# Patient Record
Sex: Female | Born: 1990 | Hispanic: No | Smoking: Current every day smoker
Health system: Southern US, Community
[De-identification: ages and names within clinical notes are randomized; demographics above are authoritative.]

---
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
## 2011-06-11 IMAGING — US US OB DETAIL+14 WK
1 series · 18 of 28 positions shown · non-contrast
Comparison: none

OBSTETRICAL ULTRASOUND:
 This ultrasound was performed in The [HOSPITAL], and the AS OB/GYN report will be stored to [REDACTED] PACS.  This report is also available in [HOSPITAL]?s accessANYware.

[Series 1: us ob detail+14 wk · 18 of 101 slices shown]
[im 1/101]
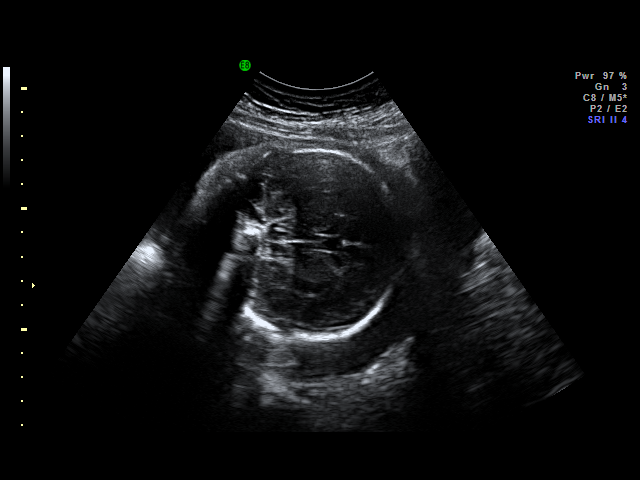
[im 8/101]
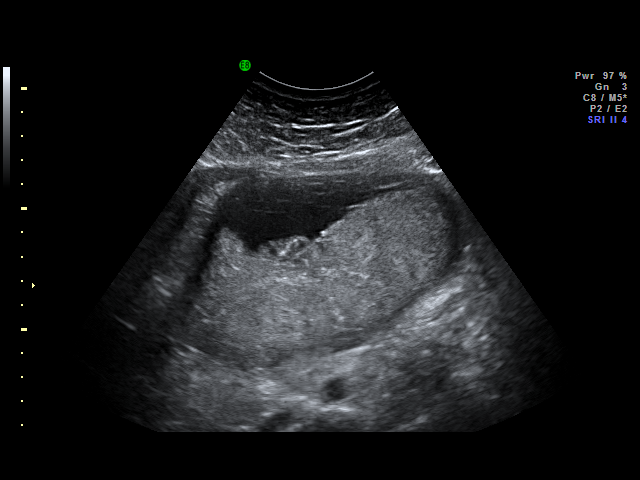
[im 12/101]
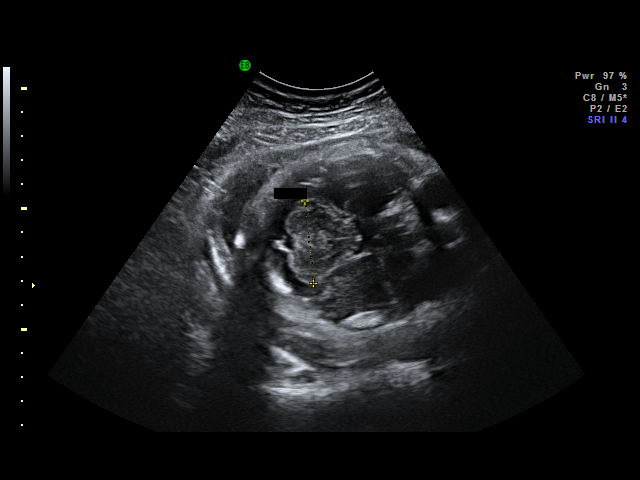
[im 19/101]
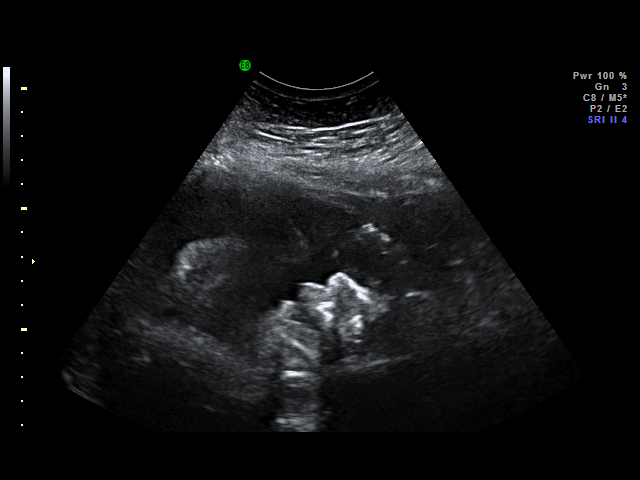
[im 26/101]
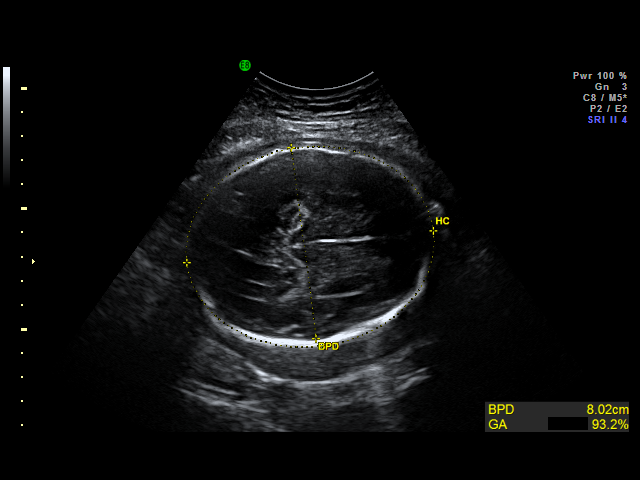
[im 30/101]
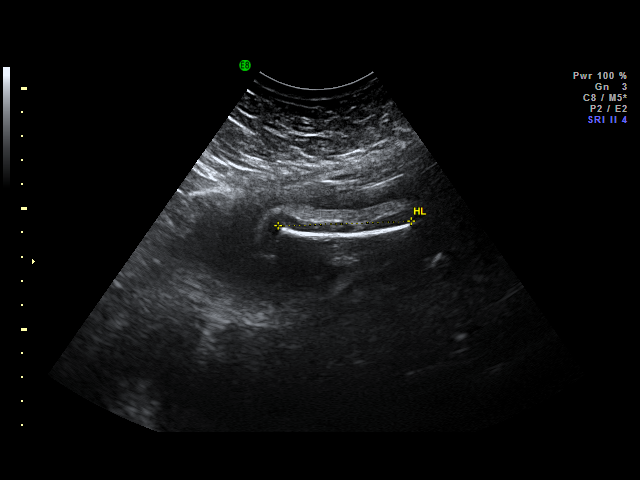
[im 38/101]
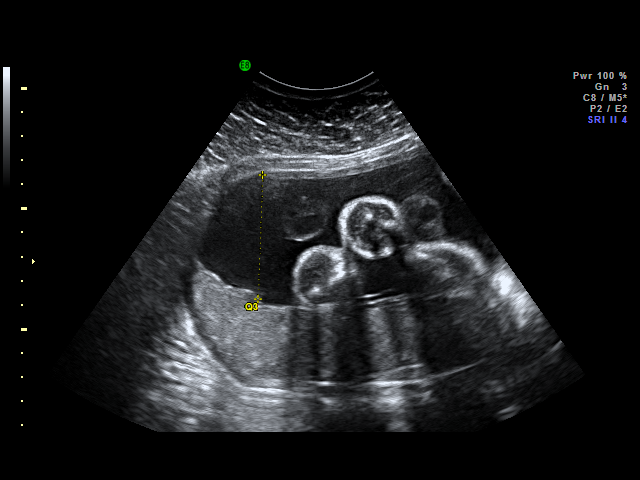
[im 41/101]
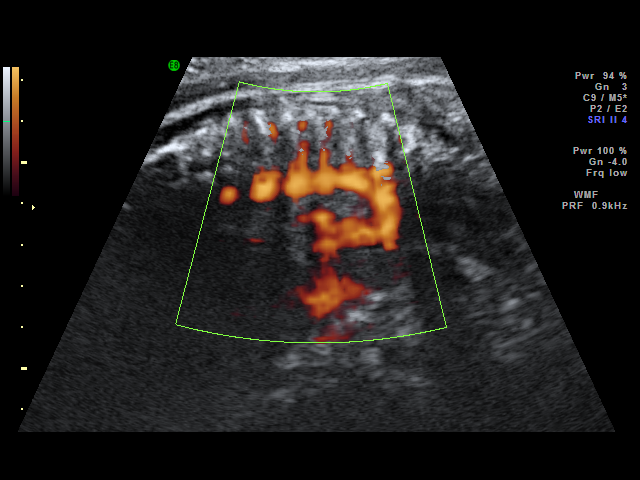
[im 49/101]
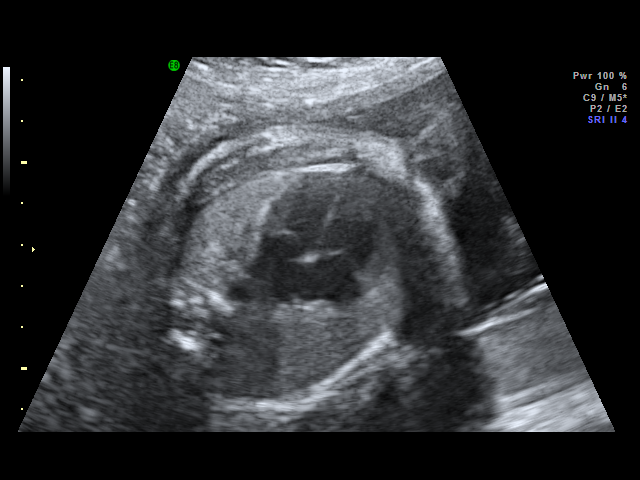
[im 52/101]
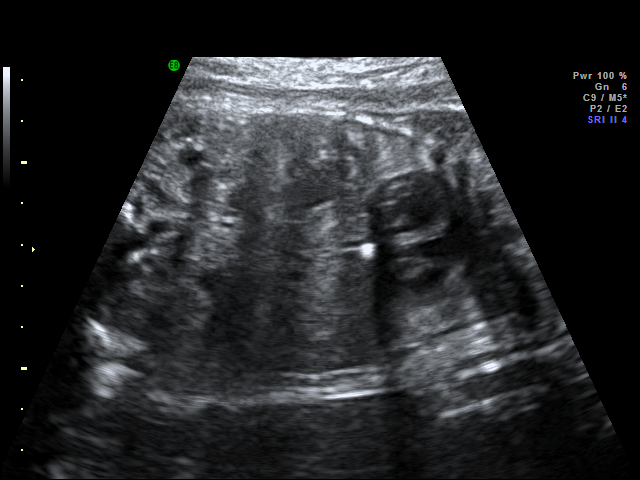
[im 60/101]
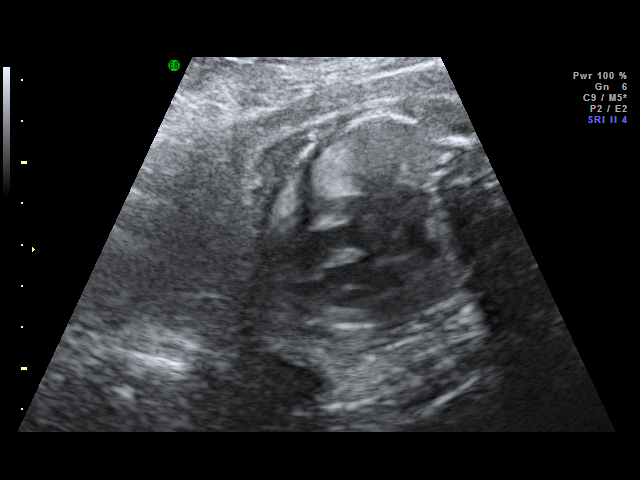
[im 63/101]
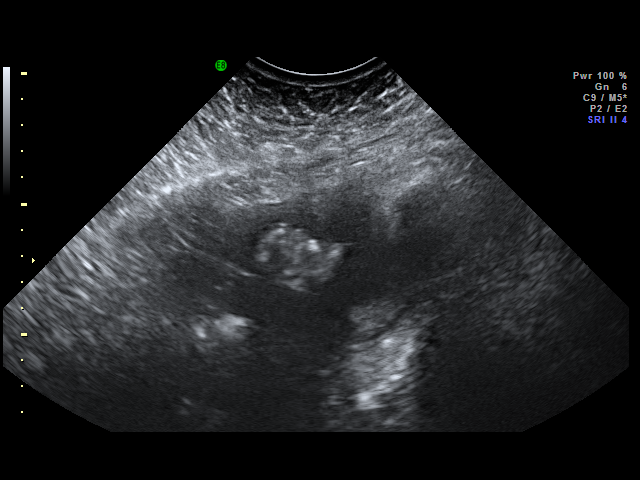
[im 71/101]
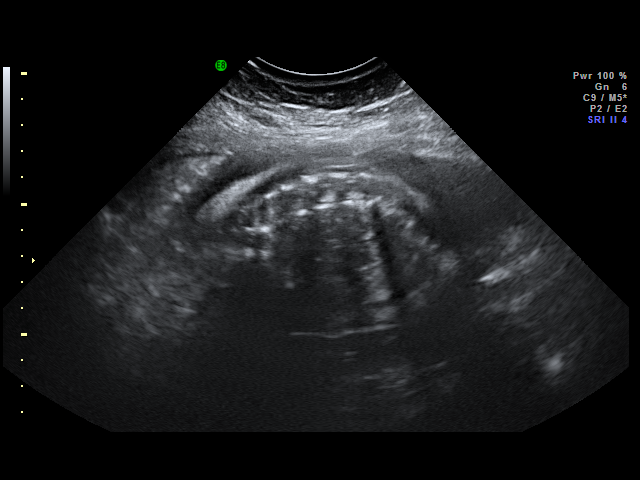
[im 78/101]
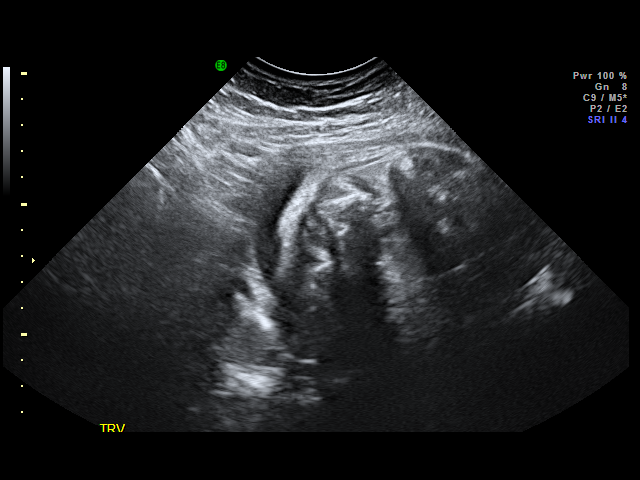
[im 82/101]
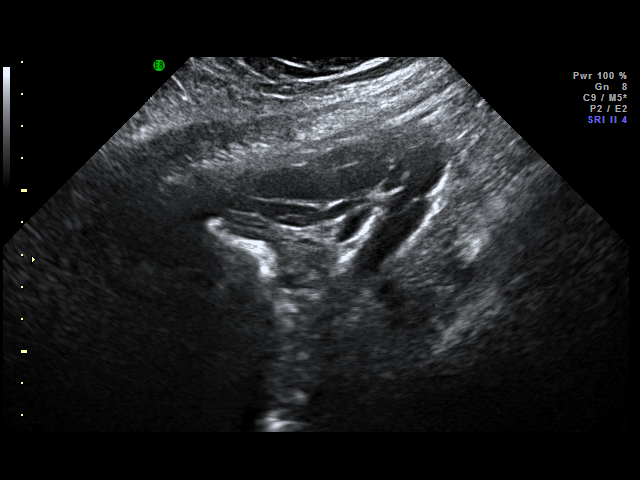
[im 89/101]
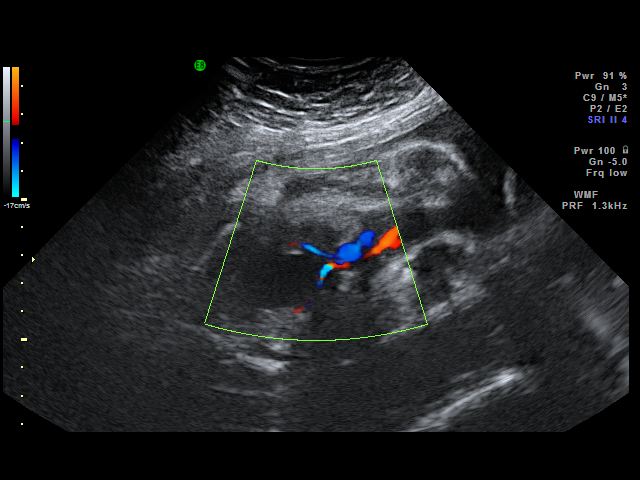
[im 93/101]
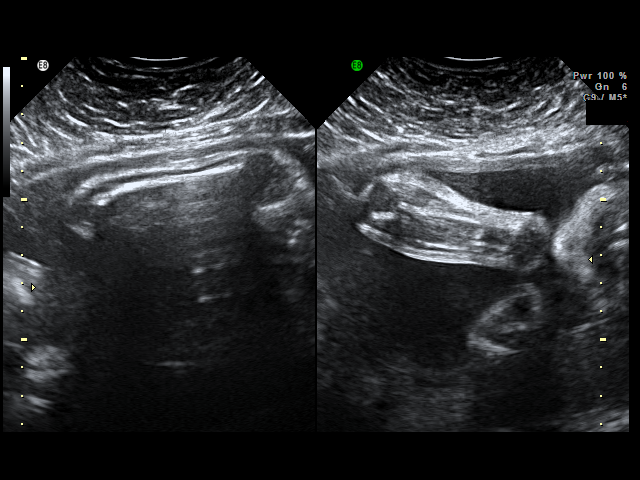
[im 101/101]
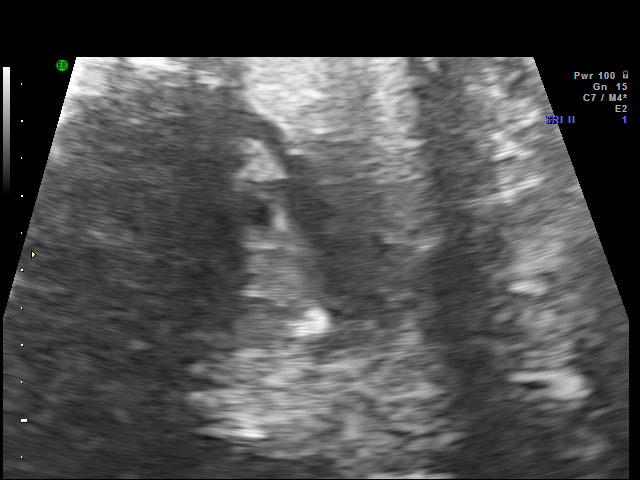

[18 of 28 positions shown; findings below may reference images not displayed]

IMPRESSION: AS OB/GYN has also been faxed to the ordering physician.

## 2011-12-15 ENCOUNTER — Ambulatory Visit: Payer: Medicaid Other | Attending: Orthopedic Surgery | Admitting: Rehabilitative and Restorative Service Providers"

## 2018-01-28 ENCOUNTER — Encounter (HOSPITAL_COMMUNITY): Payer: Self-pay | Admitting: Emergency Medicine

## 2018-01-28 ENCOUNTER — Emergency Department (HOSPITAL_COMMUNITY)
Admission: EM | Admit: 2018-01-28 | Discharge: 2018-01-28 | Discharge status: Against Medical Advice | Payer: Medicaid Other | Attending: Emergency Medicine | Admitting: Emergency Medicine

## 2018-01-28 DIAGNOSIS — Z0441 Encounter for examination and observation following alleged adult rape: Secondary | ICD-10-CM | POA: Insufficient documentation

## 2018-01-28 DIAGNOSIS — F17210 Nicotine dependence, cigarettes, uncomplicated: Secondary | ICD-10-CM | POA: Diagnosis not present

## 2018-01-28 LAB — CBC WITH DIFFERENTIAL/PLATELET
BASOS ABS: 0 10*3/uL (ref 0.0–0.1)
Basophils Relative: 0 %
Eosinophils Absolute: 0.1 10*3/uL (ref 0.0–0.7)
Eosinophils Relative: 3 %
HEMATOCRIT: 33 % — AB (ref 36.0–46.0)
Hemoglobin: 11.1 g/dL — ABNORMAL LOW (ref 12.0–15.0)
LYMPHS PCT: 33 %
Lymphs Abs: 1.8 10*3/uL (ref 0.7–4.0)
MCH: 29.1 pg (ref 26.0–34.0)
MCHC: 33.6 g/dL (ref 30.0–36.0)
MCV: 86.6 fL (ref 78.0–100.0)
Monocytes Absolute: 0.5 10*3/uL (ref 0.1–1.0)
Monocytes Relative: 8 %
NEUTROS ABS: 3.2 10*3/uL (ref 1.7–7.7)
Neutrophils Relative %: 56 %
PLATELETS: 275 10*3/uL (ref 150–400)
RBC: 3.81 MIL/uL — AB (ref 3.87–5.11)
RDW: 14.7 % (ref 11.5–15.5)
WBC: 5.6 10*3/uL (ref 4.0–10.5)

## 2018-01-28 LAB — BASIC METABOLIC PANEL
ANION GAP: 10 (ref 5–15)
BUN: 6 mg/dL (ref 6–20)
CO2: 22 mmol/L (ref 22–32)
Calcium: 8.6 mg/dL — ABNORMAL LOW (ref 8.9–10.3)
Chloride: 105 mmol/L (ref 101–111)
Creatinine, Ser: 0.43 mg/dL — ABNORMAL LOW (ref 0.44–1.00)
GFR calc non Af Amer: >60 mL/min (ref >60)
Glucose, Bld: 81 mg/dL (ref 65–99)
POTASSIUM: 3.5 mmol/L (ref 3.5–5.1)
SODIUM: 137 mmol/L (ref 135–145)
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
## 2018-01-28 NOTE — ED Notes (Signed)
SANE RN at nurse's station states that pt is sleeping and unwilling to speak with her. Delo, MD and this RN entered room to speak with pt. Pt appeared to be sleeping and giving minimal responses to questions, seemingly refusing to speak with EDP and myself. Continued encouragement to wake, speak with SANE RN, provide urine sample, and generally participate in her care. Pt now agitated.
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
## 2018-01-28 NOTE — ED Notes (Signed)
Attempted venipuncture for blood draw, without success.  

## 2018-01-28 NOTE — ED Notes (Signed)
Paged SANE nurse
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
## 2018-01-28 NOTE — ED Notes (Signed)
Pt emerged from room stating she wants to go. Pt ambulated to waiting room with steady gait.
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
## 2019-07-15 ENCOUNTER — Other Ambulatory Visit: Payer: Self-pay

## 2019-07-15 ENCOUNTER — Emergency Department (HOSPITAL_COMMUNITY)
Admission: EM | Admit: 2019-07-15 | Discharge: 2019-07-16 | Disposition: A | Discharge status: Home / Self Care | Payer: Medicaid Other | Attending: Emergency Medicine | Admitting: Emergency Medicine

## 2019-07-15 DIAGNOSIS — Z5321 Procedure and treatment not carried out due to patient leaving prior to being seen by health care provider: Secondary | ICD-10-CM | POA: Insufficient documentation

## 2019-07-15 DIAGNOSIS — M79604 Pain in right leg: Secondary | ICD-10-CM | POA: Diagnosis present
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
## 2019-07-16 NOTE — ED Notes (Signed)
Pt back into the lobby stating she is not leaving because she does not have a ride

## 2019-07-16 NOTE — ED Notes (Signed)
Pt seen leaving ED 

## 2019-07-16 NOTE — ED Notes (Signed)
Pt has consistently cried, and moaned while out in the lobby.

## 2019-07-16 NOTE — ED Notes (Signed)
Pt offered to be brought back to triage for reassessment. Pt refused and stated she wasn't waiting any longer. Pt ambulated out through front door.

## 2021-08-12 DIAGNOSIS — A419 Sepsis, unspecified organism: Secondary | ICD-10-CM

## 2021-08-16 ENCOUNTER — Emergency Department (HOSPITAL_COMMUNITY)
Admission: EM | Admit: 2021-08-16 | Discharge: 2021-08-17 | Discharge status: Against Medical Advice | Payer: Medicaid Other | Attending: Emergency Medicine | Admitting: Emergency Medicine

## 2021-08-16 ENCOUNTER — Other Ambulatory Visit: Payer: Self-pay

## 2021-08-16 DIAGNOSIS — F17210 Nicotine dependence, cigarettes, uncomplicated: Secondary | ICD-10-CM | POA: Diagnosis not present

## 2021-08-16 DIAGNOSIS — R509 Fever, unspecified: Secondary | ICD-10-CM | POA: Diagnosis not present

## 2021-08-16 DIAGNOSIS — F1910 Other psychoactive substance abuse, uncomplicated: Secondary | ICD-10-CM | POA: Diagnosis present

## 2021-08-16 NOTE — ED Triage Notes (Addendum)
Pt BIB [facility] from home. Pt has CC of SOB for the last day. Pt was recently admitted at [facility] for sepsis - given IV vanc but left AMA. Pt reports fever of 101.7 earlier today, took tylenol and it came down. 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 Pt started on eliquis in April.  Pt reporting generalized pain and CP on inspiration. Per pt she normally wears 2L [place] at home.  Pt has hx of IV drug use - last used fentanyl yesterday.

## 2021-08-17 ENCOUNTER — Emergency Department (HOSPITAL_COMMUNITY): Payer: Medicaid Other

## 2021-08-17 MED ORDER — SODIUM CHLORIDE 0.9 % IV BOLUS: 1000.0000 mL | Freq: Once | INTRAVENOUS | Status: DC

## 2021-08-17 NOTE — ED Notes (Addendum)
Pt hysterically crying. Dr. Judd Lien and security remain at bedside. Pt willing to let phlebotomy stick her, IV team to be contacted.

## 2021-08-17 NOTE — ED Notes (Signed)
IV team to come back to bedside

## 2021-08-17 NOTE — Progress Notes (Signed)
IVT: receive a call from ED nurse Alcario Drought stating patient is now agreeable for another try for an IV access. Referred to IV team on duty for this consult.Given info of patient refusal on initial visit.

## 2021-08-17 NOTE — Progress Notes (Signed)
IVT consult note:  Arrived at bedside. Phlebotomist Christel Mormon in the room talking to the patient for lab draw. I introduced myself. Patient stated not to try the left arm,showing scarred areas from previous IV sticks. Looked at R arm, noted small deep veins. After assessment of suitable vein to access with ultrasound,patient stated she doesn't want this IV nurse to even try ,stated: " You're not even very convincing.". I verified with patient is she is refusing an IV stick and stated that she doesn't want an IV. Phlebotomist was also in the room during this time. I left the bedside and ED physician came in and patient started crying and yelling. Security officers were called.

## 2021-08-17 NOTE — ED Notes (Addendum)
IV team at bedside, pt will not let IV team stick her or phlebotomist, Dr Judd Lien notified. Dr Judd Lien at bedside. Pt increasingly agitated. Pt told Dr Judd Lien to "go fuck himself" - Security called to bedside. Pt crying. Dr. Judd Lien to have security escort patient out. DC papers to be put in

## 2021-08-17 NOTE — ED Notes (Signed)
This RN attempted IV access with no luck - pt will not let this RN stick again - pt reports that she usually has to have ultrasound and has had to have a central line before. MD notified

## 2021-08-17 NOTE — ED Notes (Signed)
Visitor management called this RN and asked if pt could have visitor, this RN checked with Dr. Judd Lien who said no to pt visitors at this time. Visitor management notified

## 2021-08-17 NOTE — ED Notes (Signed)
While this RN in another room IV team came to visit patient, pt refusing to let IV team stick her again, pt upset about not being able to have visitor, wanting to leave, Dr. Judd Lien to have pt escorted out by security.

## 2021-08-17 NOTE — ED Provider Notes (Signed)
[facility] EMERGENCY DEPARTMENT Provider Note   CSN: [id number] Arrival date & time: 08/16/21  2329     History Chief Complaint  Patient presents with   Shortness of Breath    Ashley Hooper is a 30 y.o. female.  Patient is a 30 year old female with past medical history of IV drug abuse.  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  Patient recently admitted to [facility] with positive blood cultures and was being treated with vancomycin, but left AGAINST MEDICAL ADVICE 2 days ago.  She presents here feeling poorly and stating she had a fever earlier today of 101.  She states she feels short of breath, but denies chest pain.  According to records obtained from [facility], she has been noncompliant with her Eliquis despite the presence of mechanical heart valve and continues to use injectable drugs.  She admits to using fentanyl yesterday.  The history is provided by the patient.      No past medical history on file.  There are no problems to display for this patient.   No past surgical history on file.   OB History     Gravida  1   Para      Term      Preterm      AB      Living         SAB      IAB      Ectopic      Multiple      Live Births              No family history on file.  Social History   Tobacco Use   Smoking status: Every Day    Packs/day: 1.00    Types: Cigarettes   Smokeless tobacco: Never  Substance Use Topics   Alcohol use: No   Drug use: Yes    Types: Marijuana    Home Medications Prior to Admission medications   Medication Sig Start Date End Date Taking? Authorizing Provider  acetaminophen (TYLENOL) 500 MG tablet Take 1,000 mg by mouth every 6 (six) hours as needed for moderate pain or headache.   Yes [provider]  ibuprofen (ADVIL) 200 MG tablet Take 400 mg by mouth every 6 (six) hours as needed for headache or moderate  pain.   Yes [provider]    Allergies    Patient has no known allergies.  Review of Systems   Review of Systems  All other systems reviewed and are negative.  Physical Exam Updated Vital Signs BP 105/77 (BP Location: Left Arm)   Pulse 95   Temp 98 F (36.7 C) (Oral)   Resp 16   Ht 5\' 6"  (1.676 m)   Wt 42.6 kg   SpO2 99%   BMI 15.17 kg/m   Physical Exam Vitals and nursing note reviewed.  Constitutional:      General: She is not in acute distress.    Appearance: She is not diaphoretic.     Comments: Patient is awake and alert.  She is very thin and chronically ill-appearing.  HENT:     Head: Normocephalic and atraumatic.  Cardiovascular:     Rate and Rhythm: Normal rate and regular rhythm.     Heart sounds: No murmur heard.   No friction rub. No gallop.  Pulmonary:     Effort: Pulmonary effort is normal. No respiratory distress.  Breath sounds: Normal breath sounds. No wheezing.  Abdominal:     General: Bowel sounds are normal. There is no distension.     Palpations: Abdomen is soft.     Tenderness: There is no abdominal tenderness.  Musculoskeletal:        General: Normal range of motion.     Cervical back: Normal range of motion and neck supple.  Skin:    General: Skin is warm and dry.  Neurological:     General: No focal deficit present.     Mental Status: She is alert and oriented to person, place, and time.    ED Results / Procedures / Treatments   Labs (all labs ordered are listed, but only abnormal results are displayed) Labs Reviewed  CULTURE, BLOOD (ROUTINE X 2)  CULTURE, BLOOD (ROUTINE X 2)  COMPREHENSIVE METABOLIC PANEL  CBC WITH DIFFERENTIAL/PLATELET  LACTIC ACID, PLASMA  LACTIC ACID, PLASMA  HCG, SERUM, QUALITATIVE    EKG None  Radiology DG Chest Port 1 View  Result Date: 08/17/2021 CLINICAL DATA:  Dyspnea, fever, sepsis EXAM: PORTABLE CHEST 1 VIEW COMPARISON:  08/12/2020 FINDINGS: Minimal atelectasis or infiltrate noted  within the a mid lung zones and right lung base. These are better appreciated on CT examination of 08/11/2021. No new focal pulmonary infiltrate. No pneumothorax or pleural effusion. Median sternotomy has been performed. Cardiac size within normal limits. Pulmonary vascularity is normal. No acute bone abnormality. IMPRESSION: Minimal multifocal atelectasis or infiltrate, grossly stable since prior CT examination of 08/11/2021. Electronically Signed   By: Helyn Numbers M.D.   On: 08/17/2021 00:39    Procedures Procedures   Medications Ordered in ED Medications  sodium chloride 0.9 % bolus 1,000 mL (has no administration in time range)    ED Course  I have reviewed the triage vital signs and the nursing notes.  Pertinent labs & imaging results that were available during my care of the patient were reviewed by me and considered in my medical decision making (see chart for details).    MDM Rules/Calculators/A&P  Patient with history of IV drug abuse with heart valve replacement and septic emboli who continues to use IV drugs and is noncompliant with Eliquis.  She signed out AMA 2 days ago from [facility], now presents here feeling poorly and reporting fever earlier today.  After initial evaluation, work-up ordered.  She is apparently a difficult stick secondary to her having damaged her veins from IV drug use.  The IV team was contacted and the patient would not allow them to attempt IV placement.  I returned to the room to discuss this with her and expressed the importance of establishing IV access and obtaining blood.  She became agitated and aggressive.  I kindly reminded her that we were only trying to help her and that she needed to do her best to cooperate.    She became angry when I reminded her that her veins were difficult not because of we did to her, but because of her history of IV drug use.  Her reply was to point her finger in my face and say "Fuck you!"  I then excused myself  from the room and told her that security would be outside of her room and waiting to escort her out should she verbally abuse any other members of our staff.  She then began sobbing and crying hysterically for quite some time.  She agreed to another attempt at IV placement.  The IV team then returned  to attempt another IV.  I am told she was asked to hang up her phone so they could inspect her arm.  Apparently this made her angry enough that she began cursing, then got dressed, then eloped from the emergency department.  This was a very difficult encounter.  Patient is non-compliant, highly emotional, and continues to use drugs.  But in my opinion has the decision making capacity to refuse care.  Final Clinical Impression(s) / ED Diagnoses Final diagnoses:  IV drug abuse [facility])    Rx / DC Orders ED Discharge Orders     None        Geoffery Lyons, MD 08/17/21 [phone]

## 2021-08-17 NOTE — ED Notes (Signed)
IV team at bedside 
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
## 2021-08-17 NOTE — Discharge Instructions (Addendum)
Follow-up with your primary doctor in the next few days. °

## 2021-10-28 DIAGNOSIS — I4510 Unspecified right bundle-branch block: Secondary | ICD-10-CM

## 2021-12-28 DEATH — deceased

## 2023-03-16 IMAGING — DX DG CHEST 1V PORT
1 series · 1 of 1 positions shown · non-contrast
Comparison: 08/12/2020

CLINICAL DATA: Dyspnea, fever, sepsis

EXAM:
PORTABLE CHEST 1 VIEW

[chest ap]
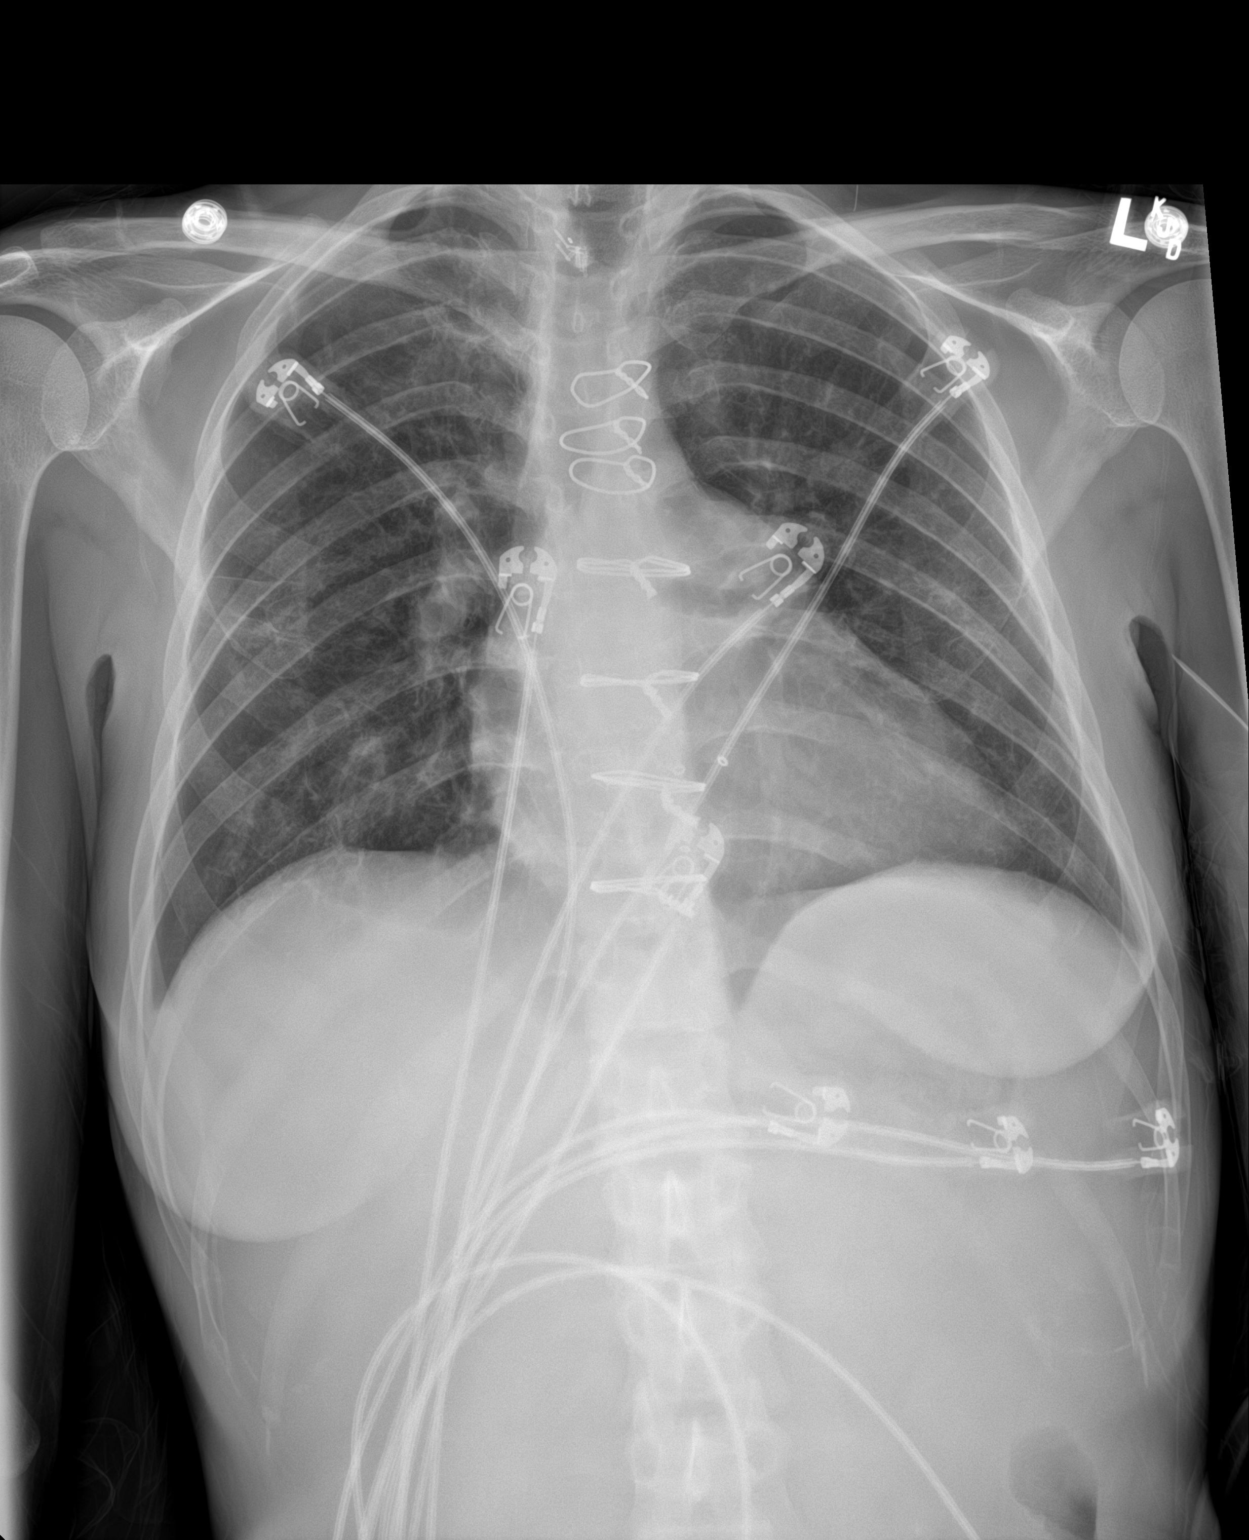

[1 of 1 positions shown; findings below may reference images not displayed]

FINDINGS: Minimal atelectasis or infiltrate noted within the a mid lung zones
and right lung base. These are better appreciated on CT examination
of 08/11/2021. No new focal pulmonary infiltrate. No pneumothorax or
pleural effusion. Median sternotomy has been performed. Cardiac size
within normal limits. Pulmonary vascularity is normal. No acute bone
abnormality.
IMPRESSION: Minimal multifocal atelectasis or infiltrate, grossly stable since
prior CT examination of 08/11/2021.
